# Patient Record
Sex: Male | Born: 1950 | Race: White | Hispanic: No | Marital: Married
Health system: Southern US, Community
[De-identification: ages and names within clinical notes are randomized; demographics above are authoritative.]

---

## 2017-05-09 ENCOUNTER — Inpatient Hospital Stay (HOSPITAL_COMMUNITY)
Admission: RE | Admit: 2017-05-09 | Discharge: 2017-06-18 | Disposition: E | Payer: Medicare Other | Attending: Internal Medicine | Admitting: Internal Medicine

## 2017-05-09 ENCOUNTER — Other Ambulatory Visit (HOSPITAL_COMMUNITY): Payer: Medicare Other

## 2017-05-09 DIAGNOSIS — J189 Pneumonia, unspecified organism: Secondary | ICD-10-CM

## 2017-05-09 DIAGNOSIS — Z4659 Encounter for fitting and adjustment of other gastrointestinal appliance and device: Secondary | ICD-10-CM

## 2017-05-09 DIAGNOSIS — T8579XA Infection and inflammatory reaction due to other internal prosthetic devices, implants and grafts, initial encounter: Secondary | ICD-10-CM

## 2017-05-09 DIAGNOSIS — Z789 Other specified health status: Secondary | ICD-10-CM

## 2017-05-09 DIAGNOSIS — D499 Neoplasm of unspecified behavior of unspecified site: Secondary | ICD-10-CM

## 2017-05-09 DIAGNOSIS — J9690 Respiratory failure, unspecified, unspecified whether with hypoxia or hypercapnia: Secondary | ICD-10-CM

## 2017-05-09 MED ORDER — POTASSIUM CHLORIDE ER 10 MEQ PO TBCR
EXTENDED_RELEASE_TABLET | ORAL | Status: DC
Start: 2017-05-09 — End: 2017-05-09

## 2017-05-09 MED ORDER — GENERIC EXTERNAL MEDICATION
3.38 | Status: DC
Start: 2017-05-09 — End: 2017-05-09

## 2017-05-09 MED ORDER — BUPROPION HCL ER (SR) 100 MG PO TB12
200.00 | ORAL_TABLET | ORAL | Status: DC
Start: 2017-05-09 — End: 2017-05-09

## 2017-05-09 MED ORDER — INSULIN GLARGINE 100 UNIT/ML ~~LOC~~ SOLN
SUBCUTANEOUS | Status: DC
Start: ? — End: 2017-05-09

## 2017-05-09 MED ORDER — FUROSEMIDE 10 MG/ML IJ SOLN
20.00 | INTRAMUSCULAR | Status: DC
Start: 2017-05-09 — End: 2017-05-09

## 2017-05-09 MED ORDER — DM-GUAIFENESIN ER 30-600 MG PO TB12
ORAL_TABLET | ORAL | Status: DC
Start: 2017-05-09 — End: 2017-05-09

## 2017-05-09 MED ORDER — ACETAMINOPHEN 650 MG RE SUPP
650.00 | RECTAL | Status: DC
Start: ? — End: 2017-05-09

## 2017-05-09 MED ORDER — GENERIC EXTERNAL MEDICATION
Status: DC
Start: ? — End: 2017-05-09

## 2017-05-09 MED ORDER — LIDOCAINE 5 % EX PTCH
MEDICATED_PATCH | CUTANEOUS | Status: DC
Start: 2017-05-09 — End: 2017-05-09

## 2017-05-09 MED ORDER — HYDRALAZINE HCL 20 MG/ML IJ SOLN
10.00 | INTRAMUSCULAR | Status: DC
Start: ? — End: 2017-05-09

## 2017-05-09 MED ORDER — TAMSULOSIN HCL 0.4 MG PO CAPS
0.40 | ORAL_CAPSULE | ORAL | Status: DC
Start: 2017-05-10 — End: 2017-05-09

## 2017-05-09 MED ORDER — ACETAMINOPHEN 325 MG PO TABS
650.00 | ORAL_TABLET | ORAL | Status: DC
Start: ? — End: 2017-05-09

## 2017-05-09 MED ORDER — ANTACID & ANTIGAS 200-200-20 MG/5ML PO SUSP
20.00 | ORAL | Status: DC
Start: ? — End: 2017-05-09

## 2017-05-09 MED ORDER — FENOFIBRATE 160 MG PO TABS
160.00 | ORAL_TABLET | ORAL | Status: DC
Start: 2017-05-10 — End: 2017-05-09

## 2017-05-09 MED ORDER — PREDNISONE 20 MG PO TABS
40.00 | ORAL_TABLET | ORAL | Status: DC
Start: 2017-05-10 — End: 2017-05-09

## 2017-05-09 MED ORDER — ENOXAPARIN SODIUM 40 MG/0.4ML ~~LOC~~ SOLN
40.00 | SUBCUTANEOUS | Status: DC
Start: 2017-05-10 — End: 2017-05-09

## 2017-05-09 MED ORDER — PRAVASTATIN SODIUM 40 MG PO TABS
40.00 | ORAL_TABLET | ORAL | Status: DC
Start: 2017-05-09 — End: 2017-05-09

## 2017-05-09 MED ORDER — ASPIRIN 81 MG PO CHEW
81.00 | CHEWABLE_TABLET | ORAL | Status: DC
Start: 2017-05-10 — End: 2017-05-09

## 2017-05-09 MED ORDER — SODIUM CHLORIDE 0.9 % IV SOLN
INTRAVENOUS | Status: DC
Start: ? — End: 2017-05-09

## 2017-05-09 MED ORDER — IPRATROPIUM-ALBUTEROL 0.5-2.5 (3) MG/3ML IN SOLN
3.00 | RESPIRATORY_TRACT | Status: DC
Start: 2017-05-09 — End: 2017-05-09

## 2017-05-09 MED ORDER — FAMOTIDINE 20 MG/2ML IV SOLN
20.00 | INTRAVENOUS | Status: DC
Start: ? — End: 2017-05-09

## 2017-05-10 ENCOUNTER — Other Ambulatory Visit (HOSPITAL_COMMUNITY): Payer: Medicare Other

## 2017-05-10 LAB — CBC WITH DIFFERENTIAL/PLATELET
BASOS PCT: 0 %
Band Neutrophils: 12 %
Basophils Absolute: 0 10*3/uL (ref 0.0–0.1)
Blasts: 0 %
EOS PCT: 0 %
Eosinophils Absolute: 0 10*3/uL (ref 0.0–0.7)
HEMATOCRIT: 30.8 % — AB (ref 39.0–52.0)
HEMOGLOBIN: 8.8 g/dL — AB (ref 13.0–17.0)
LYMPHS PCT: 4 %
Lymphs Abs: 0.7 10*3/uL (ref 0.7–4.0)
MCH: 29.1 pg (ref 26.0–34.0)
MCHC: 28.6 g/dL — ABNORMAL LOW (ref 30.0–36.0)
MCV: 102 fL — AB (ref 78.0–100.0)
MONO ABS: 0.6 10*3/uL (ref 0.1–1.0)
Metamyelocytes Relative: 4 %
Monocytes Relative: 3 %
Myelocytes: 1 %
NEUTROS PCT: 76 %
NRBC: 0 /100{WBCs}
Neutro Abs: 17.2 10*3/uL — ABNORMAL HIGH (ref 1.7–7.7)
OTHER: 0 %
PLATELETS: 375 10*3/uL (ref 150–400)
PROMYELOCYTES ABS: 0 %
RBC: 3.02 MIL/uL — AB (ref 4.22–5.81)
RDW: 19.6 % — ABNORMAL HIGH (ref 11.5–15.5)
WBC: 18.5 10*3/uL — ABNORMAL HIGH (ref 4.0–10.5)

## 2017-05-10 LAB — MAGNESIUM: Magnesium: 1.7 mg/dL (ref 1.7–2.4)

## 2017-05-10 LAB — COMPREHENSIVE METABOLIC PANEL
ALBUMIN: 2 g/dL — AB (ref 3.5–5.0)
ALT: 19 U/L (ref 17–63)
ANION GAP: 12 (ref 5–15)
AST: 29 U/L (ref 15–41)
Alkaline Phosphatase: 94 U/L (ref 38–126)
BUN: 15 mg/dL (ref 6–20)
CALCIUM: 8.7 mg/dL — AB (ref 8.9–10.3)
CO2: 39 mmol/L — AB (ref 22–32)
Chloride: 95 mmol/L — ABNORMAL LOW (ref 101–111)
Creatinine, Ser: 0.77 mg/dL (ref 0.61–1.24)
GFR calc non Af Amer: >60 mL/min (ref >60)
Glucose, Bld: 191 mg/dL — ABNORMAL HIGH (ref 65–99)
POTASSIUM: 3.7 mmol/L (ref 3.5–5.1)
SODIUM: 146 mmol/L — AB (ref 135–145)
TOTAL PROTEIN: 5.7 g/dL — AB (ref 6.5–8.1)
Total Bilirubin: 0.8 mg/dL (ref 0.3–1.2)

## 2017-05-10 LAB — HEMOGLOBIN A1C
Hgb A1c MFr Bld: 6.5 % — ABNORMAL HIGH (ref 4.8–5.6)
Mean Plasma Glucose: 139.85 mg/dL

## 2017-05-11 LAB — URINALYSIS, ROUTINE W REFLEX MICROSCOPIC
BILIRUBIN URINE: NEGATIVE
Glucose, UA: NEGATIVE mg/dL
HGB URINE DIPSTICK: NEGATIVE
Ketones, ur: NEGATIVE mg/dL
Leukocytes, UA: NEGATIVE
Nitrite: NEGATIVE
PH: 8 (ref 5.0–8.0)
Protein, ur: NEGATIVE mg/dL
SPECIFIC GRAVITY, URINE: 1.015 (ref 1.005–1.030)

## 2017-05-11 LAB — BASIC METABOLIC PANEL
Anion gap: 12 (ref 5–15)
BUN: 14 mg/dL (ref 6–20)
CHLORIDE: 92 mmol/L — AB (ref 101–111)
CO2: 42 mmol/L — ABNORMAL HIGH (ref 22–32)
CREATININE: 0.67 mg/dL (ref 0.61–1.24)
Calcium: 8.9 mg/dL (ref 8.9–10.3)
GFR calc Af Amer: >60 mL/min (ref >60)
GFR calc non Af Amer: >60 mL/min (ref >60)
Glucose, Bld: 189 mg/dL — ABNORMAL HIGH (ref 65–99)
Potassium: 3.2 mmol/L — ABNORMAL LOW (ref 3.5–5.1)
SODIUM: 146 mmol/L — AB (ref 135–145)

## 2017-05-12 LAB — EXPECTORATED SPUTUM ASSESSMENT W REFEX TO RESP CULTURE

## 2017-05-12 LAB — BASIC METABOLIC PANEL
Anion gap: 14 (ref 5–15)
BUN: 19 mg/dL (ref 6–20)
CO2: 41 mmol/L — ABNORMAL HIGH (ref 22–32)
Calcium: 9.4 mg/dL (ref 8.9–10.3)
Chloride: 92 mmol/L — ABNORMAL LOW (ref 101–111)
Creatinine, Ser: 0.73 mg/dL (ref 0.61–1.24)
GFR calc Af Amer: >60 mL/min (ref >60)
GLUCOSE: 236 mg/dL — AB (ref 65–99)
POTASSIUM: 3.5 mmol/L (ref 3.5–5.1)
Sodium: 147 mmol/L — ABNORMAL HIGH (ref 135–145)

## 2017-05-12 LAB — URINE CULTURE: Culture: 20000 — AB

## 2017-05-12 LAB — EXPECTORATED SPUTUM ASSESSMENT W GRAM STAIN, RFLX TO RESP C

## 2017-05-13 ENCOUNTER — Other Ambulatory Visit (HOSPITAL_COMMUNITY): Payer: Medicare Other

## 2017-05-13 LAB — BASIC METABOLIC PANEL
ANION GAP: 13 (ref 5–15)
BUN: 15 mg/dL (ref 6–20)
CALCIUM: 9.5 mg/dL (ref 8.9–10.3)
CHLORIDE: 97 mmol/L — AB (ref 101–111)
CO2: 40 mmol/L — AB (ref 22–32)
Creatinine, Ser: 0.73 mg/dL (ref 0.61–1.24)
GFR calc non Af Amer: >60 mL/min (ref >60)
Glucose, Bld: 266 mg/dL — ABNORMAL HIGH (ref 65–99)
POTASSIUM: 3.7 mmol/L (ref 3.5–5.1)
Sodium: 150 mmol/L — ABNORMAL HIGH (ref 135–145)

## 2017-05-13 LAB — CBC
HEMATOCRIT: 36 % — AB (ref 39.0–52.0)
HEMOGLOBIN: 10.3 g/dL — AB (ref 13.0–17.0)
MCH: 29.6 pg (ref 26.0–34.0)
MCHC: 28.6 g/dL — ABNORMAL LOW (ref 30.0–36.0)
MCV: 103.4 fL — AB (ref 78.0–100.0)
PLATELETS: 422 10*3/uL — AB (ref 150–400)
RBC: 3.48 MIL/uL — AB (ref 4.22–5.81)
RDW: 20.3 % — ABNORMAL HIGH (ref 11.5–15.5)
WBC: 33.8 10*3/uL — AB (ref 4.0–10.5)

## 2017-05-13 LAB — MAGNESIUM: Magnesium: 2 mg/dL (ref 1.7–2.4)

## 2017-05-13 MED ORDER — GENERIC EXTERNAL MEDICATION
Status: DC
Start: ? — End: 2017-05-13

## 2017-05-14 ENCOUNTER — Other Ambulatory Visit (HOSPITAL_COMMUNITY): Payer: Medicare Other

## 2017-05-14 ENCOUNTER — Encounter: Payer: Medicare Other | Admitting: Certified Registered"

## 2017-05-14 LAB — BASIC METABOLIC PANEL
Anion gap: 9 (ref 5–15)
BUN: 17 mg/dL (ref 6–20)
CHLORIDE: 99 mmol/L — AB (ref 101–111)
CO2: 43 mmol/L — AB (ref 22–32)
CREATININE: 0.62 mg/dL (ref 0.61–1.24)
Calcium: 9.5 mg/dL (ref 8.9–10.3)
GFR calc non Af Amer: >60 mL/min (ref >60)
GLUCOSE: 88 mg/dL (ref 65–99)
Potassium: 3.3 mmol/L — ABNORMAL LOW (ref 3.5–5.1)
Sodium: 151 mmol/L — ABNORMAL HIGH (ref 135–145)

## 2017-05-14 LAB — BLOOD GAS, ARTERIAL
Acid-Base Excess: 16.8 mmol/L — ABNORMAL HIGH (ref 0.0–2.0)
Bicarbonate: 42.1 mmol/L — ABNORMAL HIGH (ref 20.0–28.0)
FIO2: 100
LHR: 16 {breaths}/min
O2 SAT: 97 %
PATIENT TEMPERATURE: 98.6
PCO2 ART: 62.7 mmHg — AB (ref 32.0–48.0)
PEEP: 5 cmH2O
PH ART: 7.442 (ref 7.350–7.450)
PO2 ART: 92.9 mmHg (ref 83.0–108.0)
VT: 450 mL

## 2017-05-14 LAB — CBC
HEMATOCRIT: 32.8 % — AB (ref 39.0–52.0)
HEMOGLOBIN: 9.1 g/dL — AB (ref 13.0–17.0)
MCH: 29 pg (ref 26.0–34.0)
MCHC: 27.7 g/dL — AB (ref 30.0–36.0)
MCV: 104.5 fL — AB (ref 78.0–100.0)
Platelets: 332 10*3/uL (ref 150–400)
RBC: 3.14 MIL/uL — ABNORMAL LOW (ref 4.22–5.81)
RDW: 20.3 % — AB (ref 11.5–15.5)
WBC: 28.4 10*3/uL — ABNORMAL HIGH (ref 4.0–10.5)

## 2017-05-14 LAB — TSH: TSH: 0.975 u[IU]/mL (ref 0.350–4.500)

## 2017-05-14 MED ORDER — PHENYLEPHRINE 40 MCG/ML (10ML) SYRINGE FOR IV PUSH (FOR BLOOD PRESSURE SUPPORT)
PREFILLED_SYRINGE | INTRAVENOUS | Status: DC | PRN
  Administered 2017-05-14: 80 ug via INTRAVENOUS

## 2017-05-14 MED ORDER — GENERIC EXTERNAL MEDICATION
Status: DC
Start: ? — End: 2017-05-14

## 2017-05-14 MED ORDER — PROPOFOL 10 MG/ML IV BOLUS
INTRAVENOUS | Status: DC | PRN
  Administered 2017-05-14: 60 mg via INTRAVENOUS

## 2017-05-14 MED ORDER — SUCCINYLCHOLINE CHLORIDE 20 MG/ML IJ SOLN
INTRAMUSCULAR | Status: DC | PRN
  Administered 2017-05-14: 100 mg via INTRAVENOUS

## 2017-05-14 NOTE — Transfer of Care (Signed)
Immediate Anesthesia Transfer of Care Note  Patient: Matthew Fischer.  Procedure(s) Performed: AN AD HOC INTUBATION  Patient Location: Nursing Unit  Anesthesia Type:General  Level of Consciousness: sedated, unresponsive and Patient remains intubated per anesthesia plan  Airway & Oxygen Therapy: Patient remains intubated per anesthesia plan and Patient placed on Ventilator (see vital sign flow sheet for setting)  Post-op Assessment: Report given to RN and Post -op Vital signs reviewed and stable  Post vital signs: Reviewed and stable  Last Vitals: There were no vitals filed for this visit.  Last Pain: There were no vitals filed for this visit.       Complications: No apparent anesthesia complications

## 2017-05-14 NOTE — Anesthesia Procedure Notes (Addendum)
Procedure Name: Intubation Date/Time: 05/14/2017 8:46 PM Performed by: Myna Bright, CRNA Pre-anesthesia Checklist: Patient identified, Emergency Drugs available, Suction available and Patient being monitored Patient Re-evaluated:Patient Re-evaluated prior to induction Preoxygenation: Pre-oxygenation with 100% oxygen Induction Type: IV induction, Cricoid Pressure applied and Rapid sequence Ventilation: Mask ventilation without difficulty Laryngoscope Size: Glidescope and 3 Grade View: Grade I Tube type: Subglottic suction tube Tube size: 7.5 mm Number of attempts: 1 Airway Equipment and Method: Stylet Placement Confirmation: ETT inserted through vocal cords under direct vision,  breath sounds checked- equal and bilateral and CO2 detector Secured at: 22 cm Tube secured with: Tape Dental Injury: Teeth and Oropharynx as per pre-operative assessment

## 2017-05-14 NOTE — Anesthesia Preprocedure Evaluation (Signed)
Anesthesia Evaluation  Patient identified by MRN, date of birth, ID bandGeneral Assessment Comment:Pt somnolent on BiPAP  History of Anesthesia Complications Negative for: history of anesthetic complications  Airway Mallampati: II  TM Distance: >3 FB Neck ROM: Full    Dental  (+) Edentulous Upper, Edentulous Lower   Pulmonary COPD,  oxygen dependent,  Lung cancer Pt stable on BiPAP, O2 sat 100% Reported that desats to 30s off of BiPAP [facility] staff requests intubation   breath sounds clear to auscultation       Cardiovascular hypertension, Pt. on medications  Rhythm:Regular Rate:Tachycardia  Reportedly is s/p cardiac arrest ECHO: EF 55% with some diastolic dysfunction, mod aortic sclerosis without stenosis, mild pulm HTN   Neuro/Psych somnolent    GI/Hepatic Neg liver ROS, GERD  ,  Endo/Other  diabetes, Oral Hypoglycemic Agents  Renal/GU negative Renal ROS     Musculoskeletal   Abdominal   Peds  Hematology negative hematology ROS (+)   Anesthesia Other Findings K+ 3.3  Reproductive/Obstetrics                             Anesthesia Physical Anesthesia Plan  ASA: IV  Anesthesia Plan:    Post-op Pain Management:    Induction: Intravenous  PONV Risk Score and Plan: Treatment may vary due to age or medical condition  Airway Management Planned: Oral ETT  Additional Equipment:   Intra-op Plan:   Post-operative Plan: Post-operative intubation/ventilation  Informed Consent:   Only emergency history available  Plan Discussed with: CRNA  Anesthesia Plan Comments: (Plan intubation as per [facility])        Anesthesia Quick Evaluation

## 2017-05-15 ENCOUNTER — Other Ambulatory Visit (HOSPITAL_COMMUNITY): Payer: Medicare Other

## 2017-05-15 ENCOUNTER — Encounter: Payer: Medicare Other | Admitting: Certified Registered Nurse Anesthetist

## 2017-05-15 LAB — BASIC METABOLIC PANEL
Anion gap: 11 (ref 5–15)
BUN: 20 mg/dL (ref 6–20)
CHLORIDE: 101 mmol/L (ref 101–111)
CO2: 34 mmol/L — AB (ref 22–32)
Calcium: 9 mg/dL (ref 8.9–10.3)
Creatinine, Ser: 0.77 mg/dL (ref 0.61–1.24)
GFR calc Af Amer: >60 mL/min (ref >60)
GLUCOSE: 213 mg/dL — AB (ref 65–99)
POTASSIUM: 4.5 mmol/L (ref 3.5–5.1)
Sodium: 146 mmol/L — ABNORMAL HIGH (ref 135–145)

## 2017-05-15 LAB — BLOOD GAS, ARTERIAL
Acid-Base Excess: 16.2 mmol/L — ABNORMAL HIGH (ref 0.0–2.0)
Bicarbonate: 41.7 mmol/L — ABNORMAL HIGH (ref 20.0–28.0)
FIO2: 100
MECHVT: 450 mL
O2 SAT: 95.8 %
PATIENT TEMPERATURE: 98.6
PCO2 ART: 64.2 mmHg — AB (ref 32.0–48.0)
PEEP/CPAP: 5 cmH2O
PH ART: 7.428 (ref 7.350–7.450)
PO2 ART: 82.8 mmHg — AB (ref 83.0–108.0)
RATE: 16 resp/min

## 2017-05-15 LAB — CULTURE, RESPIRATORY W GRAM STAIN: Gram Stain: NONE SEEN

## 2017-05-15 LAB — CBC
HEMATOCRIT: 31.4 % — AB (ref 39.0–52.0)
Hemoglobin: 8.9 g/dL — ABNORMAL LOW (ref 13.0–17.0)
MCH: 29.2 pg (ref 26.0–34.0)
MCHC: 28.3 g/dL — ABNORMAL LOW (ref 30.0–36.0)
MCV: 103 fL — AB (ref 78.0–100.0)
PLATELETS: 337 10*3/uL (ref 150–400)
RBC: 3.05 MIL/uL — AB (ref 4.22–5.81)
RDW: 19.8 % — ABNORMAL HIGH (ref 11.5–15.5)
WBC: 34.1 10*3/uL — ABNORMAL HIGH (ref 4.0–10.5)

## 2017-05-15 LAB — CULTURE, RESPIRATORY

## 2017-05-15 MED ORDER — GENERIC EXTERNAL MEDICATION
Status: DC
Start: ? — End: 2017-05-15

## 2017-05-15 MED ORDER — PROPOFOL 10 MG/ML IV BOLUS
INTRAVENOUS | Status: DC | PRN
  Administered 2017-05-15: 60 mg via INTRAVENOUS

## 2017-05-15 NOTE — Anesthesia Postprocedure Evaluation (Signed)
Anesthesia Post Note  Patient: Matthew Fischer.  Procedure(s) Performed: AN AD HOC INTUBATION     Patient location during evaluation: SICU Anesthesia Type: General Level of consciousness: sedated Pain management: pain level controlled Vital Signs Assessment: post-procedure vital signs reviewed and stable Respiratory status: patient remains intubated per anesthesia plan Cardiovascular status: stable Postop Assessment: no apparent nausea or vomiting Anesthetic complications: no    Last Vitals: There were no vitals filed for this visit.  Last Pain: There were no vitals filed for this visit.               [place]

## 2017-05-16 LAB — BASIC METABOLIC PANEL
ANION GAP: 8 (ref 5–15)
BUN: 18 mg/dL (ref 6–20)
CALCIUM: 8.7 mg/dL — AB (ref 8.9–10.3)
CO2: 37 mmol/L — AB (ref 22–32)
CREATININE: 0.68 mg/dL (ref 0.61–1.24)
Chloride: 99 mmol/L — ABNORMAL LOW (ref 101–111)
Glucose, Bld: 215 mg/dL — ABNORMAL HIGH (ref 65–99)
POTASSIUM: 3.9 mmol/L (ref 3.5–5.1)
SODIUM: 144 mmol/L (ref 135–145)

## 2017-05-16 LAB — CBC
HCT: 27.1 % — ABNORMAL LOW (ref 39.0–52.0)
HEMOGLOBIN: 7.9 g/dL — AB (ref 13.0–17.0)
MCH: 29.8 pg (ref 26.0–34.0)
MCHC: 29.2 g/dL — ABNORMAL LOW (ref 30.0–36.0)
MCV: 102.3 fL — ABNORMAL HIGH (ref 78.0–100.0)
PLATELETS: 261 10*3/uL (ref 150–400)
RBC: 2.65 MIL/uL — AB (ref 4.22–5.81)
RDW: 19.7 % — ABNORMAL HIGH (ref 11.5–15.5)
WBC: 26.8 10*3/uL — AB (ref 4.0–10.5)

## 2017-05-16 LAB — PHOSPHORUS: PHOSPHORUS: 3.2 mg/dL (ref 2.5–4.6)

## 2017-05-16 LAB — MAGNESIUM: MAGNESIUM: 1.9 mg/dL (ref 1.7–2.4)

## 2017-05-16 MED ORDER — IOPAMIDOL (ISOVUE-300) INJECTION 61%
INTRAVENOUS | Status: AC
  Filled 2017-05-16: qty 75

## 2017-05-17 ENCOUNTER — Other Ambulatory Visit (HOSPITAL_COMMUNITY): Payer: Medicare Other

## 2017-05-17 LAB — RENAL FUNCTION PANEL
ALBUMIN: 1.7 g/dL — AB (ref 3.5–5.0)
Anion gap: 8 (ref 5–15)
BUN: 20 mg/dL (ref 6–20)
CALCIUM: 8.7 mg/dL — AB (ref 8.9–10.3)
CHLORIDE: 97 mmol/L — AB (ref 101–111)
CO2: 37 mmol/L — ABNORMAL HIGH (ref 22–32)
CREATININE: 0.59 mg/dL — AB (ref 0.61–1.24)
Glucose, Bld: 236 mg/dL — ABNORMAL HIGH (ref 65–99)
PHOSPHORUS: 2.4 mg/dL — AB (ref 2.5–4.6)
Potassium: 4.2 mmol/L (ref 3.5–5.1)
Sodium: 142 mmol/L (ref 135–145)

## 2017-05-17 LAB — CBC
HEMATOCRIT: 27.2 % — AB (ref 39.0–52.0)
Hemoglobin: 7.7 g/dL — ABNORMAL LOW (ref 13.0–17.0)
MCH: 29.2 pg (ref 26.0–34.0)
MCHC: 28.3 g/dL — ABNORMAL LOW (ref 30.0–36.0)
MCV: 103 fL — AB (ref 78.0–100.0)
PLATELETS: 257 10*3/uL (ref 150–400)
RBC: 2.64 MIL/uL — AB (ref 4.22–5.81)
RDW: 19.2 % — ABNORMAL HIGH (ref 11.5–15.5)
WBC: 28.8 10*3/uL — AB (ref 4.0–10.5)

## 2017-05-17 LAB — BLOOD GAS, ARTERIAL
Acid-Base Excess: 14 mmol/L — ABNORMAL HIGH (ref 0.0–2.0)
Bicarbonate: 39.5 mmol/L — ABNORMAL HIGH (ref 20.0–28.0)
FIO2: 70
MECHVT: 450 mL
O2 Saturation: 92.4 %
PEEP: 8 cmH2O
PH ART: 7.411 (ref 7.350–7.450)
PO2 ART: 68.5 mmHg — AB (ref 83.0–108.0)
Patient temperature: 97.1
RATE: 16 resp/min
pCO2 arterial: 62.8 mmHg — ABNORMAL HIGH (ref 32.0–48.0)

## 2017-05-17 LAB — PHOSPHORUS: PHOSPHORUS: 2.4 mg/dL — AB (ref 2.5–4.6)

## 2017-05-17 LAB — MAGNESIUM: MAGNESIUM: 2 mg/dL (ref 1.7–2.4)

## 2017-05-17 MED ORDER — GENERIC EXTERNAL MEDICATION
Status: DC
Start: ? — End: 2017-05-17

## 2017-05-17 MED ORDER — IOPAMIDOL (ISOVUE-300) INJECTION 61%
INTRAVENOUS | Status: AC
  Administered 2017-05-17: 75 mL
  Filled 2017-05-17: qty 75

## 2017-05-18 LAB — CBC
HCT: 31.3 % — ABNORMAL LOW (ref 39.0–52.0)
Hemoglobin: 9.1 g/dL — ABNORMAL LOW (ref 13.0–17.0)
MCH: 29.9 pg (ref 26.0–34.0)
MCHC: 29.1 g/dL — AB (ref 30.0–36.0)
MCV: 103 fL — ABNORMAL HIGH (ref 78.0–100.0)
PLATELETS: 240 10*3/uL (ref 150–400)
RBC: 3.04 MIL/uL — ABNORMAL LOW (ref 4.22–5.81)
RDW: 19.7 % — ABNORMAL HIGH (ref 11.5–15.5)
WBC: 47.7 10*3/uL — ABNORMAL HIGH (ref 4.0–10.5)

## 2017-05-18 LAB — MAGNESIUM: MAGNESIUM: 2.3 mg/dL (ref 1.7–2.4)

## 2017-05-18 LAB — BASIC METABOLIC PANEL
Anion gap: 10 (ref 5–15)
BUN: 42 mg/dL — AB (ref 6–20)
CO2: 34 mmol/L — ABNORMAL HIGH (ref 22–32)
CREATININE: 0.82 mg/dL (ref 0.61–1.24)
Calcium: 9.1 mg/dL (ref 8.9–10.3)
Chloride: 99 mmol/L — ABNORMAL LOW (ref 101–111)
GFR calc Af Amer: >60 mL/min (ref >60)
Glucose, Bld: 202 mg/dL — ABNORMAL HIGH (ref 65–99)
Potassium: 5.3 mmol/L — ABNORMAL HIGH (ref 3.5–5.1)
SODIUM: 143 mmol/L (ref 135–145)

## 2017-05-18 LAB — PHOSPHORUS: Phosphorus: 3.9 mg/dL (ref 2.5–4.6)

## 2017-05-18 DEATH — deceased

## 2017-05-20 LAB — CULTURE, BLOOD (ROUTINE X 2)
CULTURE: NO GROWTH
Culture: NO GROWTH
SPECIAL REQUESTS: ADEQUATE
Special Requests: ADEQUATE

## 2017-05-20 MED FILL — Medication: Qty: 1 | Status: AC

## 2017-06-18 DEATH — deceased

## 2018-07-20 IMAGING — CT CT CHEST W/ CM
2 of 3 series · 13 of 36 positions shown, 16 images · IV contrast (iopamidol)
Comparison: None.

CLINICAL DATA: 66-year-old male with no history and physical in the
electronic record. Peripheral records indicate the patient has
received care at no find [REDACTED] for adenocarcinoma. No prior
CT imaging available. Recent intubation for respiratory support

EXAM:
CT CHEST WITH CONTRAST
TECHNIQUE: Multidetector CT imaging of the chest was performed during
intravenous contrast administration.
CONTRAST:  75mL 0LIVOX-U00 IOPAMIDOL (0LIVOX-U00) INJECTION 61%

[Series 3: thorax 2.0 i31f 2 · axial · 0.83mm/px · z∈[+998,+1240]mm · 10 of 143 slices shown, 13 images]
[im 11/143  mediastinal]
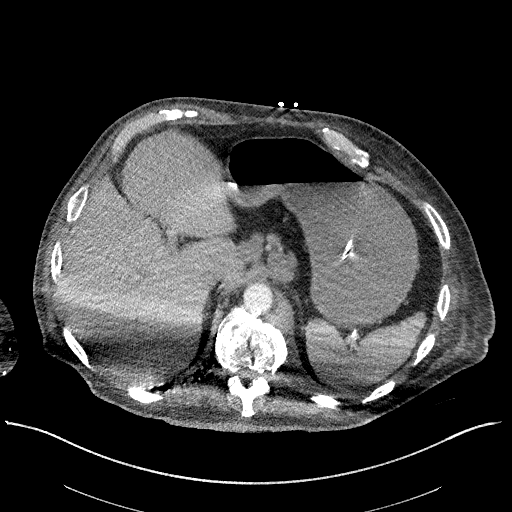
[im 11/143  lung]
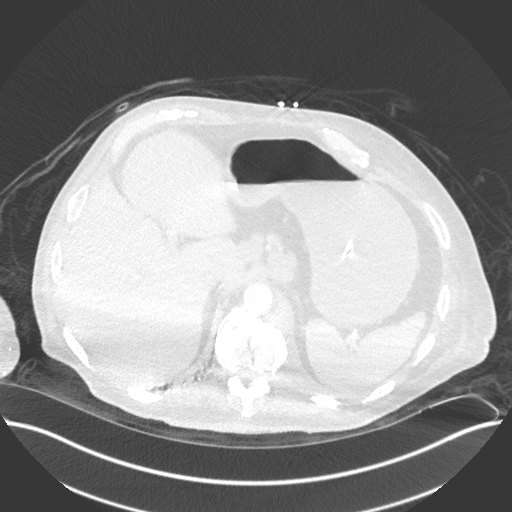
[im 22/143  lung]
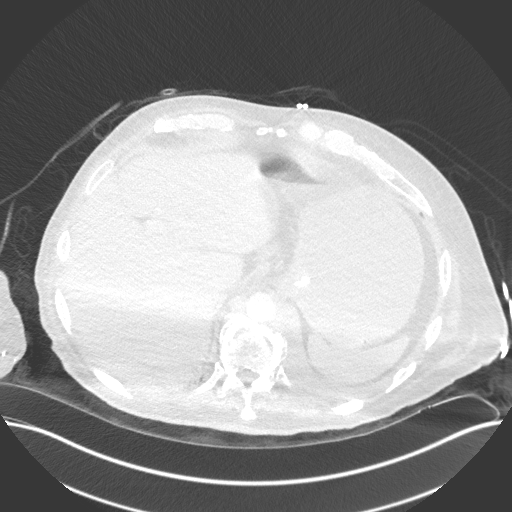
[im 37/143  lung]
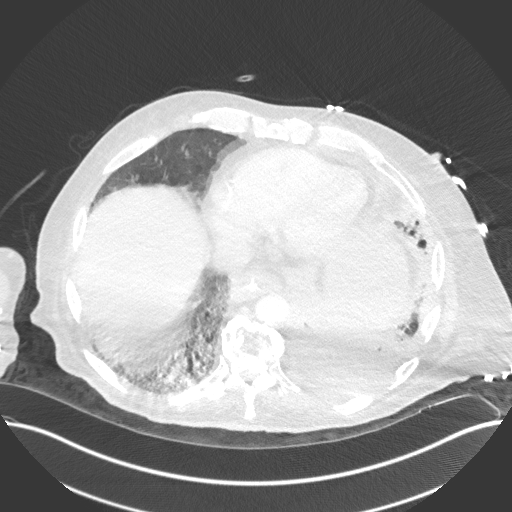
[im 53/143  lung]
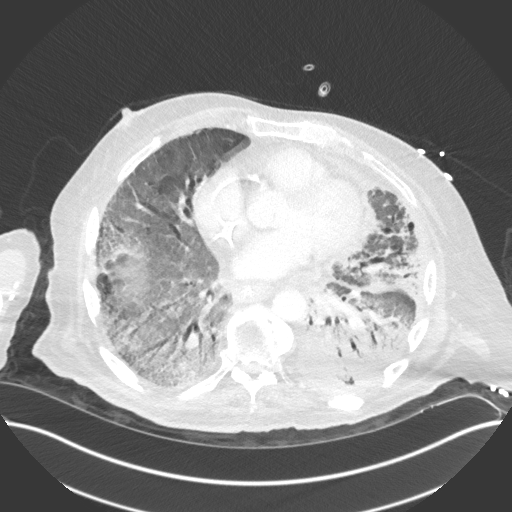
[im 64/143  mediastinal]
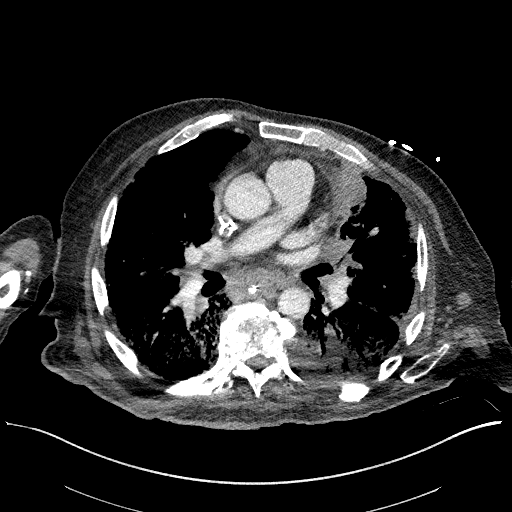
[im 64/143  lung]
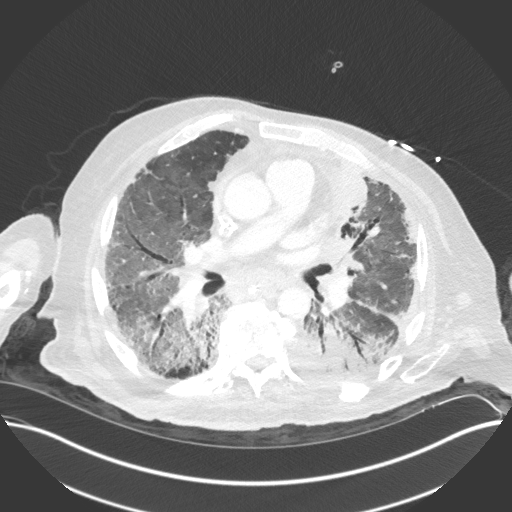
[im 79/143  lung]
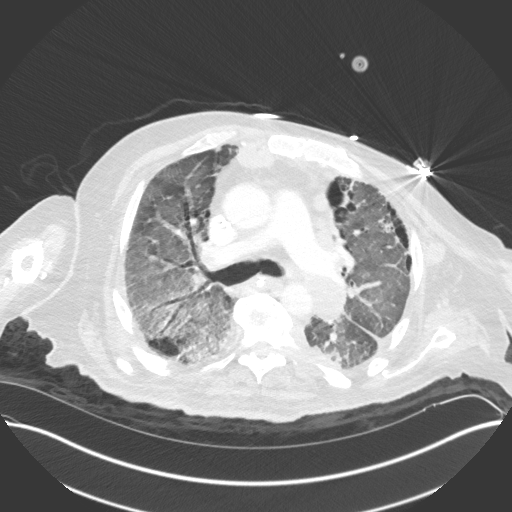
[im 90/143  lung]
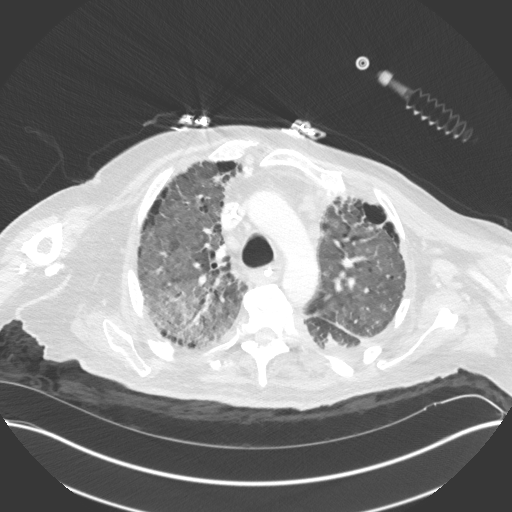
[im 106/143  lung]
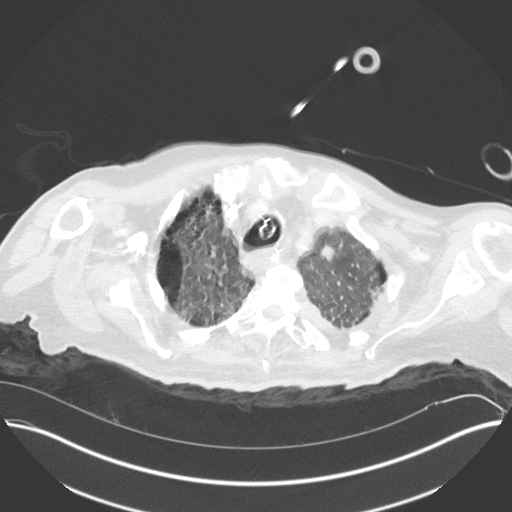
[im 121/143  mediastinal]
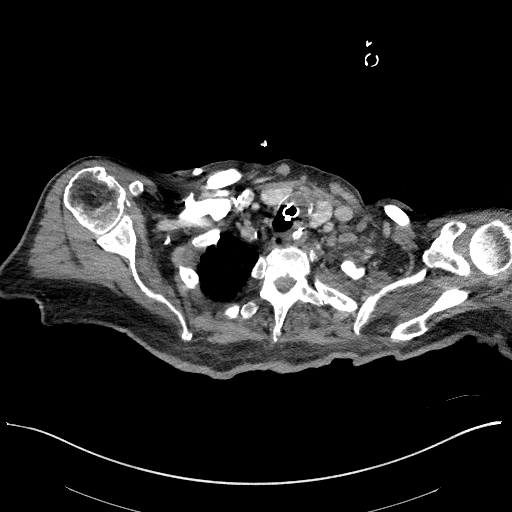
[im 121/143  lung]
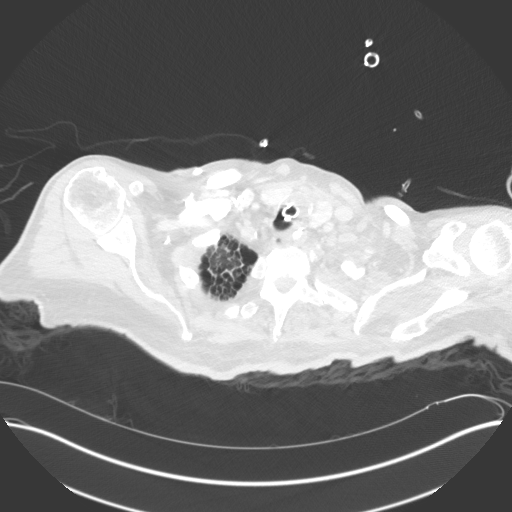
[im 132/143  lung]
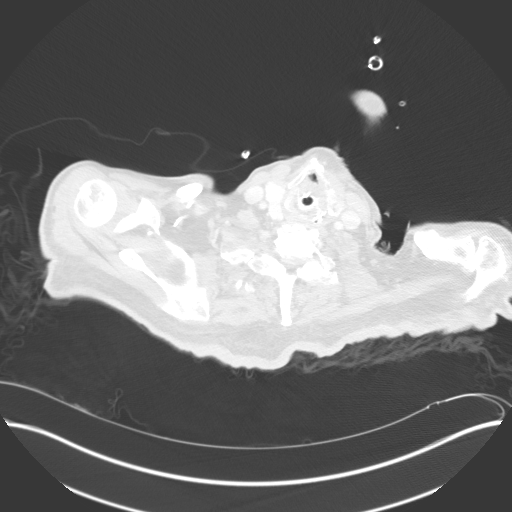

[Series 5: coronal · coronal · 0.59mm/px · 3 of 140 slices shown]
[im 28/140  lung]
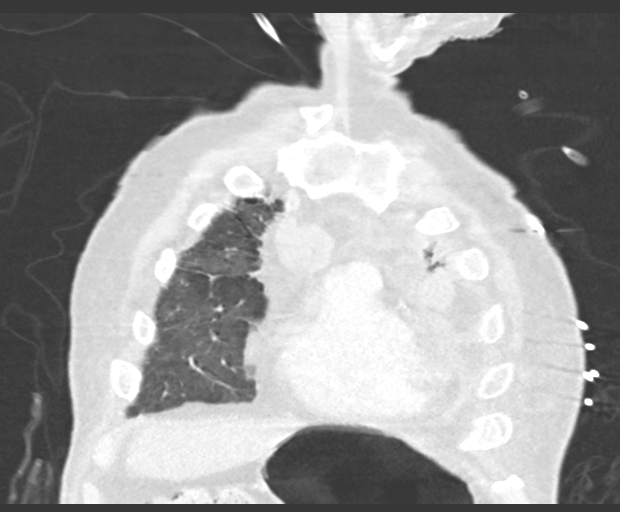
[im 56/140  lung]
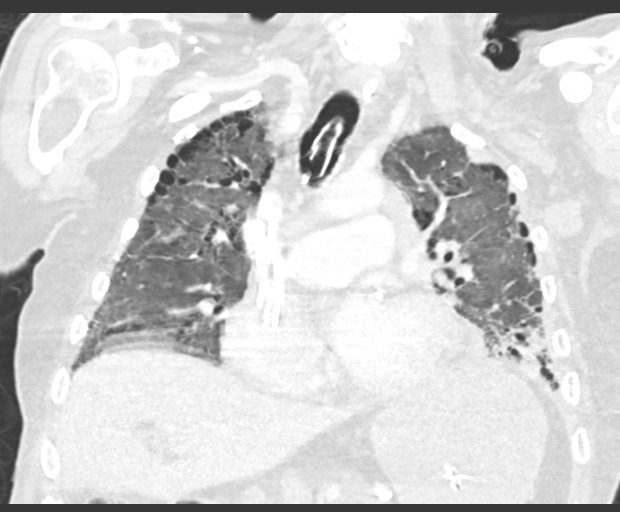
[im 84/140  lung]
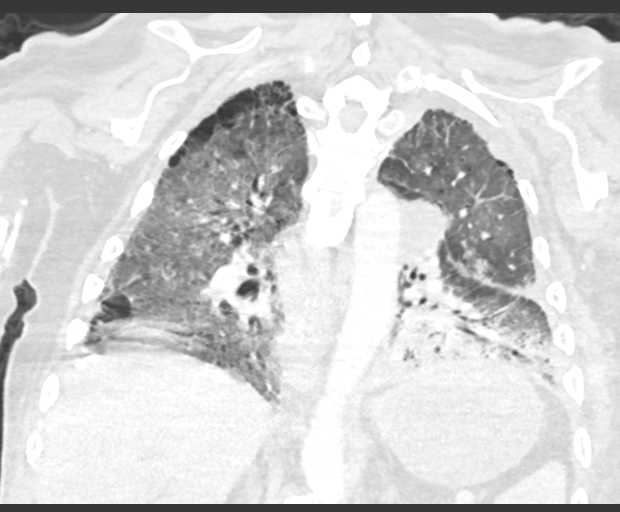

[13 of 36 positions shown; findings below may reference images not displayed]

FINDINGS: Cardiovascular: Heart size unremarkable. Dense calcifications of
left main, left anterior descending, circumflex, right coronary
artery.

Tumor tissue involves the left-sided internal mammary lymph nodes,
with multiple small implants in the anterior pericardium. There is
abnormal tissue involving the medial left-sided pleura extending to
the pericardium overlying the left ventricle. Trace pericardial
thickening/fluid at the inferior aspect of the heart.

Unremarkable caliber of the thoracic aorta with no dissection or
aneurysm. Mild atherosclerotic changes. Branch vessels are patent.

No central or lobar filling defects. The segmental and subsegmental
vessels are poorly evaluated given the technique and timing of the
contrast bolus. Respiratory motion also decreases the sensitivity.

Mediastinum/Nodes: Multiple pathologic mediastinal lymph nodes of
the upper mediastinum, left greater than right axillary and
supraclavicular nodal stations, the bilateral internal mammary
nodes, AP window nodes, subcarinal nodes, and left greater than
right hilar lymph nodes.

Pathologic left-sided axillary lymph nodes and subpectoral lymph
nodes. Several small right lower cervical lymph nodes, suspicious by
number though not enlarged.

Additional pleural base mass invading the mediastinum, posterior to
the left lobar pulmonary artery and appears to extend to the
thoracic aorta (image 65) measures 4.6 cm.

Lungs/Pleura: Dense interstitial airspace consolidation of the left
lower lobe, predominantly at the dependent aspects, though also
involving superior and lateral segments. Associated interlobular
septal thickening with multiple air bronchograms of the left lower
lobe. Thickening of the fissures of the left lung, with
circumferential thickening of the pleura on the left. Irregular soft
tissue extends from the costophrenic sulcus along the left
diaphragmatic crus, with the largest diameter of this tissue
measuring 2.8 cm, largest AP diameter measuring 7.8 cm.

Soft tissue mass at the anterior lingula invades the mediastinum,
centered at the pleura measures 3.5 cm.

On the right there is lesser degree of interlobular septal
thickening of the right lower lobe, with associated ground-glass
opacity of the more dependent lung base. No endobronchial debris. No
right-sided pleural effusion.

Bilateral lungs demonstrate subpleural reticulation an architectural
distortion with background of centrilobular and paraseptal
emphysema.

Pulmonary nodule of the left upper lobe contiguous with the pleura,
likely metastatic.

Upper Abdomen: Pathologic lymph nodes of the gastrohepatic ligament,
with the largest measuring 3.8 cm x 2.7 cm.

Musculoskeletal: Osteopenia with advanced degenerative changes of
the spine including flowing anterior osteophyte production. There
are several sclerotic foci the thoracic spine favored to represent
metastatic disease, including T5, T6, T7, bilateral ribs.
IMPRESSION: The study demonstrates advanced metastatic malignancy, with the
electronic record reporting history of lung cancer. Sites of disease
include mediastinal lymph nodes/ internal mammary nodes, the
left-sided pleura, left pulmonary nodules, left axillary lymph
nodes, posterior mediastinum/left diaphragmatic crus, gastrohepatic
lymph nodes of the upper abdomen, lower neck/ supraclavicular
region, bilateral ribs, multiple thoracic vertebral bodies,
pericardium, and likely bilateral lymphangitic carcinomatosis of
lower lungs.

Although the mixed interstitial and airspace opacities of the left
greater than right lung bases is favored to represent
carcinomatosis, superimposed aspiration pneumonitis/ pneumonia
and/or pulmonary edema could have this appearance. Any comparison
studies from the patient's prior hospitalization may be useful for
comparison.

Endotracheal tube terminates suitably above the carina. Gastric tube
terminates in the stomach. Port catheter on the right chest wall.

Left main and 3 vessel coronary artery disease. Aortic
Atherosclerosis ([id number]).
